# Patient Record
Sex: Male | Born: 1990 | Race: Black or African American | Hispanic: No | Marital: Single | Smoking: Former smoker
Health system: Southern US, Community
[De-identification: ages and names within clinical notes are randomized; demographics above are authoritative.]

## PROBLEM LIST (undated history)

## (undated) DIAGNOSIS — J45909 Unspecified asthma, uncomplicated: Secondary | ICD-10-CM

---

## 2008-09-03 ENCOUNTER — Emergency Department (HOSPITAL_COMMUNITY): Admission: EM | Admit: 2008-09-03 | Discharge: 2008-09-03 | Payer: Self-pay | Admitting: Emergency Medicine

## 2009-06-10 ENCOUNTER — Emergency Department (HOSPITAL_COMMUNITY): Admission: EM | Admit: 2009-06-10 | Discharge: 2009-06-10 | Payer: Self-pay | Admitting: Emergency Medicine

## 2010-08-11 IMAGING — CR DG CHEST 2V
2 series · 2 of 2 positions shown · non-contrast
Comparison: None.

CLINICAL DATA: Shortness breath for 3 days .

CHEST - 2 VIEW

[view not recorded (1 of 2)]
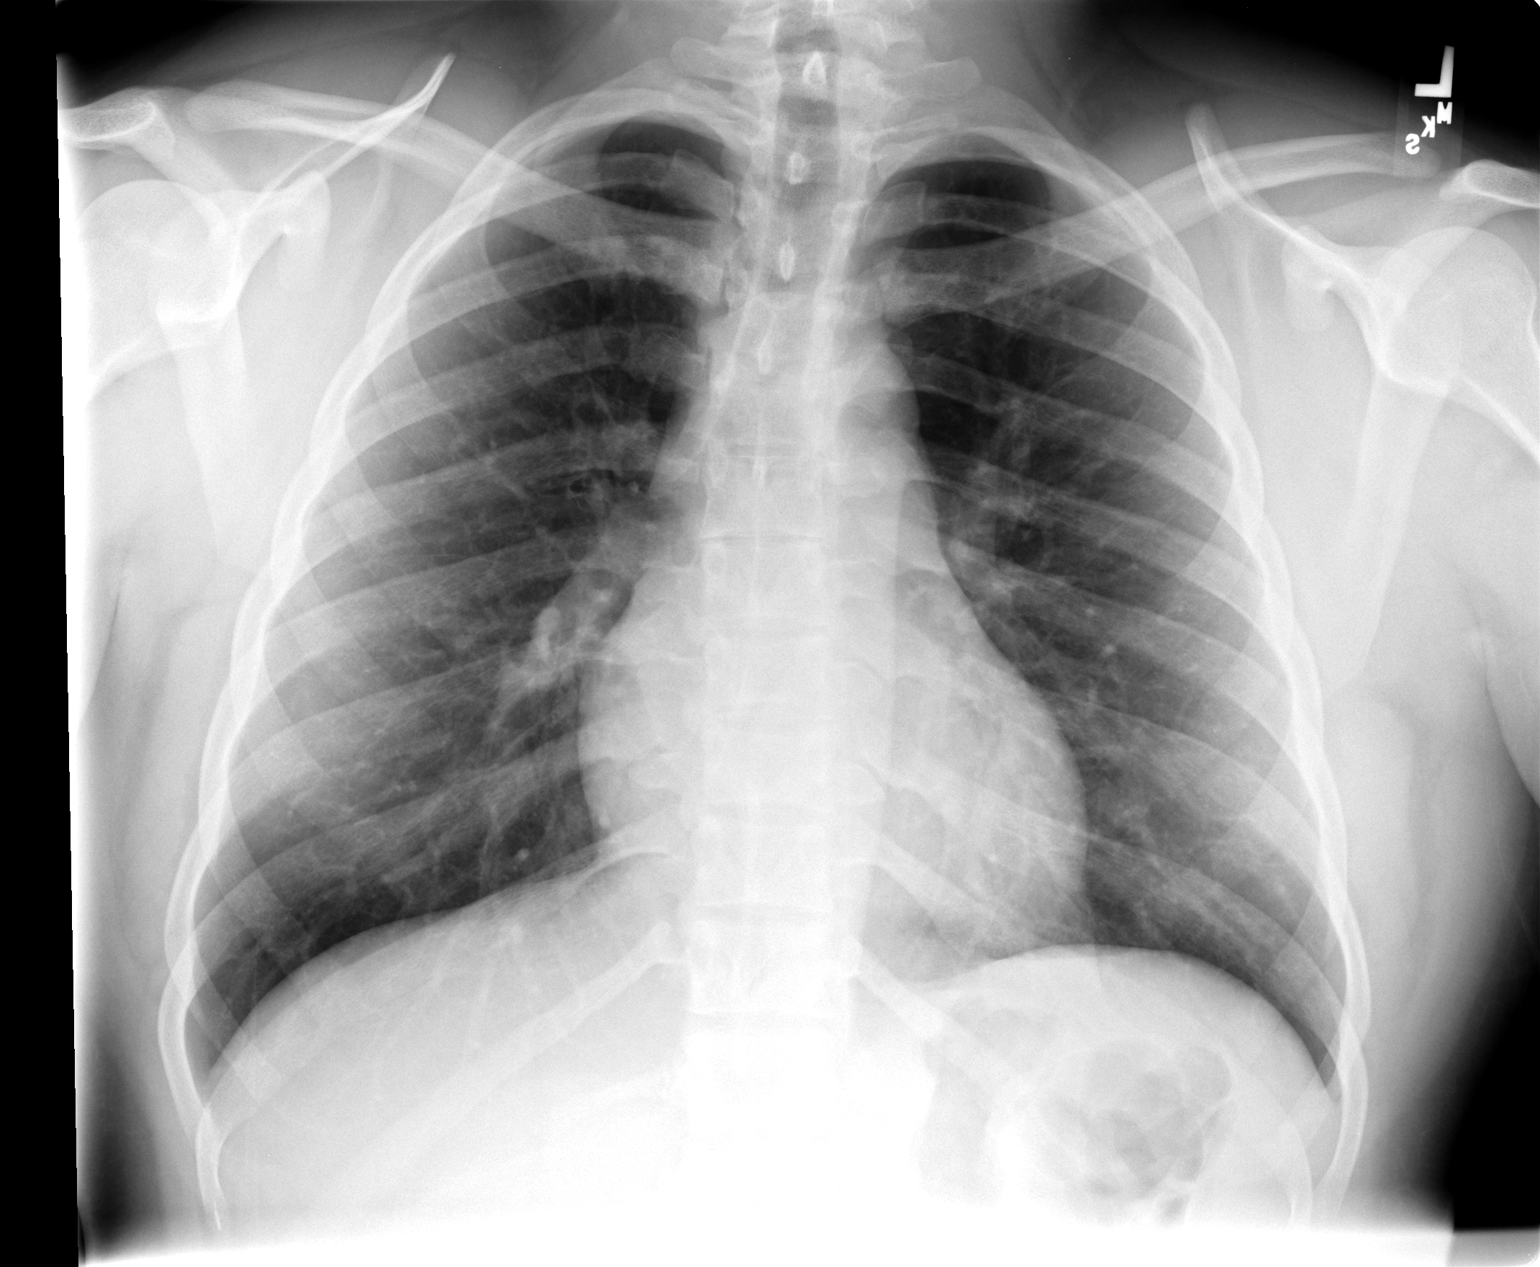

[view not recorded (2 of 2)]
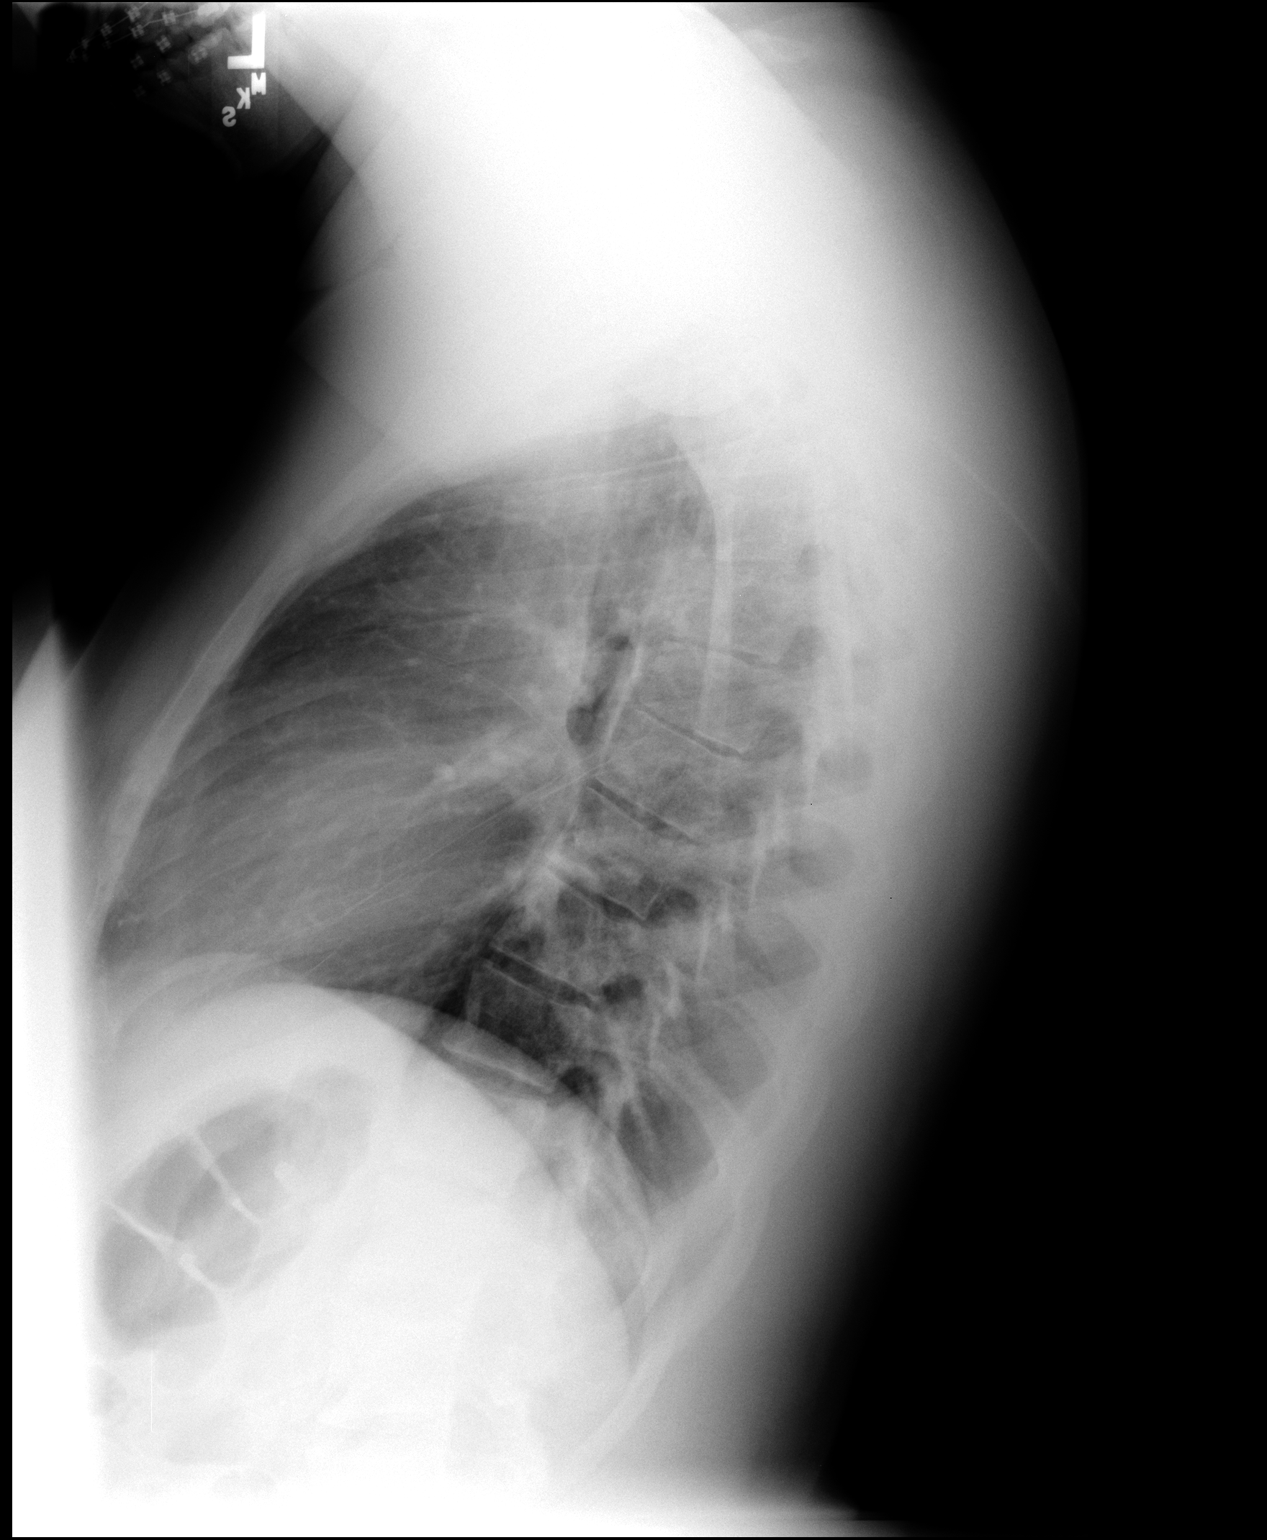

[2 of 2 positions shown; findings below may reference images not displayed]

FINDINGS: Normal mediastinum and heart silhouette.  Costophrenic
angles are clear.  No effusion, infiltrate, or pneumothorax.
IMPRESSION: Normal chest radiograph.

## 2013-11-29 ENCOUNTER — Ambulatory Visit (HOSPITAL_COMMUNITY): Payer: Self-pay | Admitting: Psychiatry

## 2017-07-03 ENCOUNTER — Other Ambulatory Visit: Payer: Self-pay

## 2017-07-03 ENCOUNTER — Emergency Department (HOSPITAL_COMMUNITY): Payer: Medicaid Other

## 2017-07-03 ENCOUNTER — Emergency Department (HOSPITAL_COMMUNITY)
Admission: EM | Admit: 2017-07-03 | Discharge: 2017-07-03 | Disposition: A | Discharge status: Home / Self Care | Payer: Medicaid Other | Attending: Emergency Medicine | Admitting: Emergency Medicine

## 2017-07-03 ENCOUNTER — Encounter (HOSPITAL_COMMUNITY): Payer: Self-pay | Admitting: *Deleted

## 2017-07-03 DIAGNOSIS — J45909 Unspecified asthma, uncomplicated: Secondary | ICD-10-CM | POA: Diagnosis not present

## 2017-07-03 DIAGNOSIS — F17200 Nicotine dependence, unspecified, uncomplicated: Secondary | ICD-10-CM | POA: Diagnosis not present

## 2017-07-03 DIAGNOSIS — R079 Chest pain, unspecified: Secondary | ICD-10-CM | POA: Diagnosis present

## 2017-07-03 HISTORY — DX: Unspecified asthma, uncomplicated: J45.909

## 2017-07-03 LAB — BASIC METABOLIC PANEL
Anion gap: 11 (ref 5–15)
BUN: 9 mg/dL (ref 6–20)
CO2: 24 mmol/L (ref 22–32)
CREATININE: 0.96 mg/dL (ref 0.61–1.24)
Calcium: 9.6 mg/dL (ref 8.9–10.3)
Chloride: 105 mmol/L (ref 101–111)
GFR calc Af Amer: >60 mL/min (ref >60)
GLUCOSE: 100 mg/dL — AB (ref 65–99)
POTASSIUM: 4.7 mmol/L (ref 3.5–5.1)
SODIUM: 140 mmol/L (ref 135–145)

## 2017-07-03 LAB — CBC WITH DIFFERENTIAL/PLATELET
BASOS ABS: 0 10*3/uL (ref 0.0–0.1)
BASOS PCT: 0 %
EOS ABS: 0.1 10*3/uL (ref 0.0–0.7)
Eosinophils Relative: 1 %
HCT: 44.1 % (ref 39.0–52.0)
Hemoglobin: 14.4 g/dL (ref 13.0–17.0)
Lymphocytes Relative: 34 %
Lymphs Abs: 2.7 10*3/uL (ref 0.7–4.0)
MCH: 28.9 pg (ref 26.0–34.0)
MCHC: 32.7 g/dL (ref 30.0–36.0)
MCV: 88.4 fL (ref 78.0–100.0)
MONO ABS: 0.4 10*3/uL (ref 0.1–1.0)
MONOS PCT: 5 %
Neutro Abs: 4.8 10*3/uL (ref 1.7–7.7)
Neutrophils Relative %: 60 %
Platelets: 200 10*3/uL (ref 150–400)
RBC: 4.99 MIL/uL (ref 4.22–5.81)
RDW: 13.7 % (ref 11.5–15.5)
WBC: 8 10*3/uL (ref 4.0–10.5)

## 2017-07-03 LAB — I-STAT TROPONIN, ED: Troponin i, poc: 0 ng/mL (ref 0.00–0.08)

## 2017-07-03 NOTE — ED Triage Notes (Signed)
The pt has asthma and he is c/o chest pain since last pm  He was seen at an urgent care in [place]  He  Reports that he was not told anything.  No audible wheezes no sob

## 2017-07-03 NOTE — ED Notes (Signed)
The pt had a chest xray last pm

## 2017-07-03 NOTE — ED Provider Notes (Signed)
[facility] Provider Note   CSN: [id number] Arrival date & time: 07/03/17  0045     History   Chief Complaint Chief Complaint  Patient presents with  . Asthma    HPI Bill Cummings is a 27 y.o. male.  The history is provided by the patient and medical records.  Asthma  Associated symptoms include chest pain.     27 year old male with history of asthma, anxiety, depression, presenting to the ED with chest pain.  States it feels like a tightness in the left side of his chest.  This is been ongoing for 3 days now.  Symptoms have been constant since onset.  States he feels like he cannot take a deep breath.  Symptoms do seem worse when up and moving around or when he focuses on them, better when sitting down and relaxing.  He is a smoker.  He has no known cardiac history.  He has not had any cough, fever, or other upper respiratory symptoms.  Patient was recently restarted on Zoloft and Ativan prescribed by his primary care doctor.  States when he took these in the past he had similar symptoms, states his doctor still restarted him on these but at lower doses than prior.  States he feels like his whole body she is "out of whack" now.  Past Medical History:  Diagnosis Date  . Asthma     There are no active problems to display for this patient.   History reviewed. No pertinent surgical history.      Home Medications    Prior to Admission medications   Not on File    Family History No family history on file.  Social History Social History   Tobacco Use  . Smoking status: Current Every Day Smoker  . Smokeless tobacco: Never Used  Substance Use Topics  . Alcohol use: Not on file  . Drug use: Not on file     Allergies   Tylenol [acetaminophen]   Review of Systems Review of Systems  Respiratory: Positive for chest tightness.   Cardiovascular: Positive for chest pain.  All other systems reviewed and are  negative.    Physical Exam Updated Vital Signs BP (!) 159/94 (BP Location: Right Arm)   Pulse 66   Temp 98.4 F (36.9 C) (Oral)   Resp 16   Ht 5\' 6"  (1.676 m)   Wt 77.1 kg (170 lb)   SpO2 100%   BMI 27.44 kg/m   Physical Exam  Constitutional: He is oriented to person, place, and time. He appears well-developed and well-nourished.  HENT:  Head: Normocephalic and atraumatic.  Mouth/Throat: Oropharynx is clear and moist.  Eyes: Pupils are equal, round, and reactive to light. Conjunctivae and EOM are normal.  Neck: Normal range of motion.  Cardiovascular: Normal rate, regular rhythm and normal heart sounds.  Pulmonary/Chest: Effort normal and breath sounds normal. No stridor. No respiratory distress.  Abdominal: Soft. Bowel sounds are normal. There is no tenderness. There is no rebound.  Musculoskeletal: Normal range of motion.  Neurological: He is alert and oriented to person, place, and time.  Skin: Skin is warm and dry.  Psychiatric: He has a normal mood and affect.  Nursing note and vitals reviewed.    ED Treatments / Results  Labs (all labs ordered are listed, but only abnormal results are displayed) Labs Reviewed  BASIC METABOLIC PANEL - Abnormal; Notable for the following components:      Result Value  Glucose, Bld 100 (*)    All other components within normal limits  CBC WITH DIFFERENTIAL/PLATELET  I-STAT TROPONIN, ED    EKG EKG Interpretation  Date/Time:  Sunday July 03 2017 01:09:46 EDT Ventricular Rate:  76 PR Interval:  170 QRS Duration: 100 QT Interval:  384 QTC Calculation: 432 R Axis:   34 Text Interpretation:  Normal sinus rhythm probable LVH No significant change since last tracing Confirmed by Zadie Rhine ([id number]) on 07/03/2017 2:49:17 AM   Radiology Dg Chest 2 View  Result Date: 07/03/2017 CLINICAL DATA:  Asthma and chest pain since last evening. EXAM: CHEST - 2 VIEW COMPARISON:  06/10/2009 CXR FINDINGS: The heart size and mediastinal  contours are within normal limits. Both lungs are clear. The visualized skeletal structures are unremarkable. IMPRESSION: No active cardiopulmonary disease. Electronically Signed   By: Tollie Eth M.D.   On: 07/03/2017 03:00    Procedures Procedures (including critical care time)  Medications Ordered in ED Medications - No data to display   Initial Impression / Assessment and Plan / ED Course  I have reviewed the triage vital signs and the nursing notes.  Pertinent labs & imaging results that were available during my care of the patient were reviewed by me and considered in my medical decision making (see chart for details).  27 year old male presenting to the ED with chest pain.  Has been ongoing for 3 days.  Seen at urgent care yesterday and had chest x-ray but was never told the results were given any information about his diagnosis.  States he has had these symptoms before when taking psychiatric medications.  He was restarted on these last week by his PCP.  Currently taking sertraline and Ativan.  EKG is normal sinus rhythm, no acute ischemic changes.  Lab work is reassuring.  Chest x-ray is clear.  Patient's vitals have remained stable throughout ED stay.  He has not had any tachycardia or hypoxia to suggest PE.  I have low suspicion for ACS, dissection, acute cardiac event.  Some of his symptoms may be related to his anxiety versus medication side effects.  I recommended that he follow-up closely with his primary care doctor to see if any adjustments to medications should be made.  Discussed plan with patient, he acknowledged understanding and agreed with plan of care.  Return precautions given for new or worsening symptoms.  Final Clinical Impressions(s) / ED Diagnoses   Final diagnoses:  Chest pain in adult    ED Discharge Orders    None       Garlon Hatchet, PA-C 07/03/17 0536    Zadie Rhine, MD 07/03/17 [phone]

## 2017-07-03 NOTE — Discharge Instructions (Signed)
If you continue having symptoms I would stop your medications. You need to contact your doctor and have another follow-up to address your symptoms/medications. Return here for any new/acute changes.

## 2017-12-03 ENCOUNTER — Emergency Department (HOSPITAL_COMMUNITY)
Admission: EM | Admit: 2017-12-03 | Discharge: 2017-12-03 | Disposition: A | Discharge status: Home / Self Care | Payer: Medicaid Other | Attending: Emergency Medicine | Admitting: Emergency Medicine

## 2017-12-03 ENCOUNTER — Encounter (HOSPITAL_COMMUNITY): Payer: Self-pay | Admitting: Emergency Medicine

## 2017-12-03 DIAGNOSIS — J45909 Unspecified asthma, uncomplicated: Secondary | ICD-10-CM | POA: Insufficient documentation

## 2017-12-03 DIAGNOSIS — R05 Cough: Secondary | ICD-10-CM | POA: Diagnosis present

## 2017-12-03 DIAGNOSIS — Z87891 Personal history of nicotine dependence: Secondary | ICD-10-CM | POA: Diagnosis not present

## 2017-12-03 DIAGNOSIS — Z79899 Other long term (current) drug therapy: Secondary | ICD-10-CM | POA: Insufficient documentation

## 2017-12-03 DIAGNOSIS — B9789 Other viral agents as the cause of diseases classified elsewhere: Secondary | ICD-10-CM | POA: Diagnosis not present

## 2017-12-03 DIAGNOSIS — J069 Acute upper respiratory infection, unspecified: Secondary | ICD-10-CM | POA: Diagnosis not present

## 2017-12-03 MED ORDER — FLUTICASONE PROPIONATE 50 MCG/ACT NA SUSP: 2.0000 | Freq: Every day | NASAL | 0 refills | Status: AC

## 2017-12-03 MED ORDER — CETIRIZINE HCL 10 MG PO TABS: 10.0000 mg | ORAL_TABLET | Freq: Every day | ORAL | 0 refills | Status: AC

## 2017-12-03 MED ORDER — DEXTROMETHORPHAN POLISTIREX ER 30 MG/5ML PO SUER: 30.0000 mg | Freq: Every evening | ORAL | 0 refills | Status: AC | PRN

## 2017-12-03 NOTE — ED Notes (Signed)
Pt verbalizes understanding of d/c instructions. Prescriptions reviewed with patient. Pt ambulatory at d/c with all belongings.  

## 2017-12-03 NOTE — ED Triage Notes (Signed)
Reports living in an apartment for a couple of months.  Unsure if there is black mold there.  Has not seen any but having allergy type symptoms.  Runny nose and cough.  Denies any fevers.

## 2017-12-03 NOTE — Discharge Instructions (Addendum)
Drink plenty water and get plenty of rest.  Gargle warm salt water and spit it out for sore throat. May also use cough drops, warm teas, etc. Take flonase to decrease nasal congestion. Zyrtec for nasal congestion and scratchy throat.  Use Delsym for cough.  Can also use your albuterol inhaler and albuterol nebulizers as needed for shortness of breath.   Followup with your primary care doctor in 5-7 days for recheck of ongoing symptoms. Return to emergency department for emergent changing or worsening of symptoms such as throat tightness, facial swelling, fever not controlled by ibuprofen or Tylenol,difficulty breathing, or chest pain.

## 2017-12-03 NOTE — ED Provider Notes (Signed)
[facility] EMERGENCY DEPARTMENT Provider Note   CSN: [id number] Arrival date & time: 12/03/17  1907     History   Chief Complaint Chief Complaint  Patient presents with  . wellness check    HPI Bill Cummings is a 27 y.o. male with history of asthma presents today for evaluation of acute onset, progressively improving shortness of breath beginning today.  He states that when he woke this morning he felt as though he could not take a deep breath.  He states this feels like a mild asthma exacerbation.  He notes nasal congestion for "a long time "which he states is consistent with his allergies and the seasons changing.  He has been using Mucinex with some relief.  He notes cough which is sometimes productive of green sputum.  Notes mild sore throat.  Denies fevers, chest pain, or headaches.  No aggravating or alleviating factors.  He is a former smoker.  He does note that he has some mold growing in his bedroom which he noticed this morning. The history is provided by the patient.    Past Medical History:  Diagnosis Date  . Asthma     There are no active problems to display for this patient.   History reviewed. No pertinent surgical history.      Home Medications    Prior to Admission medications   Medication Sig Start Date End Date Taking? Authorizing Provider  cetirizine (ZYRTEC ALLERGY) 10 MG tablet Take 1 tablet (10 mg total) by mouth daily. 12/03/17   Wesly Whisenant A, PA-C  dextromethorphan (DELSYM) 30 MG/5ML liquid Take 5 mLs (30 mg total) by mouth at bedtime as needed for cough. 12/03/17   Kamyia Thomason A, PA-C  fluticasone (FLONASE) 50 MCG/ACT nasal spray Place 2 sprays into both nostrils daily. 12/03/17   Brandilee Pies A, PA-C  LORazepam (ATIVAN) 1 MG tablet Take 1 mg by mouth every 8 (eight) hours as needed for anxiety.  06/28/17   [provider]  sertraline (ZOLOFT) 50 MG tablet Take 50 mg by mouth daily. 06/28/17   [provider]    Family  History No family history on file.  Social History Social History   Tobacco Use  . Smoking status: Former Games developer  . Smokeless tobacco: Never Used  Substance Use Topics  . Alcohol use: Yes    Comment: occasionally  . Drug use: Never     Allergies   Acetaminophen   Review of Systems Review of Systems  Constitutional: Negative for chills and fever.  HENT: Positive for congestion, sneezing and sore throat. Negative for trouble swallowing.   Respiratory: Positive for cough and shortness of breath.   Cardiovascular: Negative for chest pain.  Neurological: Negative for headaches.     Physical Exam Updated Vital Signs BP 128/78 (BP Location: Right Arm)   Pulse 90   Temp 99.3 F (37.4 C) (Oral)   Resp 16   Ht 5\' 8"  (1.727 m)   Wt 89.8 kg   SpO2 99%   BMI 30.11 kg/m   Physical Exam  Constitutional: He appears well-developed and well-nourished. No distress.  HENT:  Head: Normocephalic and atraumatic.  TMs without erythema or bulging bilaterally.  Nasal septum midline, mucosal edema noted bilaterally with pale pink boggy mucosa.  No frontal or maxillary sinus tenderness.  Posterior oropharynx with postnasal drip, no tonsillar hypertrophy, exudates, uvular deviation, or trismus.  Tolerating secretions without difficulty.  Eyes: Pupils are equal, round, and reactive to light. Conjunctivae  and EOM are normal. Right eye exhibits no discharge. Left eye exhibits no discharge.  Neck: Normal range of motion. Neck supple. No JVD present. No tracheal deviation present.  Cardiovascular: Normal rate, regular rhythm and normal heart sounds.  Pulmonary/Chest: Effort normal and breath sounds normal. No stridor. No respiratory distress. He has no wheezes. He has no rales. He exhibits no tenderness.  Abdominal: He exhibits no distension.  Musculoskeletal: He exhibits no edema.  Neurological: He is alert.  Skin: Skin is warm and dry. No erythema.  Psychiatric: He has a normal mood and  affect. His behavior is normal.  Nursing note and vitals reviewed.    ED Treatments / Results  Labs (all labs ordered are listed, but only abnormal results are displayed) Labs Reviewed - No data to display  EKG None  Radiology No results found.  Procedures Procedures (including critical care time)  Medications Ordered in ED Medications - No data to display   Initial Impression / Assessment and Plan / ED Course  I have reviewed the triage vital signs and the nursing notes.  Pertinent labs & imaging results that were available during my care of the patient were reviewed by me and considered in my medical decision making (see chart for details).     Patient with symptoms consistent with viral URI with cough.  He declines chest x-ray at this time and I have a low suspicion of pneumonia in the absence of fever and patient is overall well-appearing.  Doubt PE.  He is afebrile, vital signs are stable. patients symptoms are consistent with URI, likely viral etiology. Discussed that antibiotics are not indicated for viral infections. Pt will be discharged with symptomatic treatment.  No increased work of breathing, lungs clear to auscultation bilaterally.  Do recommend use of his nebulizers and albuterol rescue inhaler as needed.  Discussed red ED return precautions. Pt verbalized understanding of and agreement with plan and is safe for discharge home at this time.    Final Clinical Impressions(s) / ED Diagnoses   Final diagnoses:  Viral URI with cough    ED Discharge Orders         Ordered    fluticasone (FLONASE) 50 MCG/ACT nasal spray  Daily     12/03/17 2019    cetirizine (ZYRTEC ALLERGY) 10 MG tablet  Daily     12/03/17 2019    dextromethorphan (DELSYM) 30 MG/5ML liquid  At bedtime PRN     12/03/17 2019           Jeanie Sewer, PA-C 12/03/17 2021    Margarita Grizzle, MD 12/03/17 [phone]

## 2017-12-03 NOTE — ED Notes (Signed)
See EDP assessment 

## 2021-02-26 DIAGNOSIS — Z419 Encounter for procedure for purposes other than remedying health state, unspecified: Secondary | ICD-10-CM | POA: Diagnosis not present

## 2021-03-29 DIAGNOSIS — Z419 Encounter for procedure for purposes other than remedying health state, unspecified: Secondary | ICD-10-CM | POA: Diagnosis not present

## 2021-04-29 DIAGNOSIS — Z419 Encounter for procedure for purposes other than remedying health state, unspecified: Secondary | ICD-10-CM | POA: Diagnosis not present

## 2021-05-27 DIAGNOSIS — Z419 Encounter for procedure for purposes other than remedying health state, unspecified: Secondary | ICD-10-CM | POA: Diagnosis not present

## 2021-06-27 DIAGNOSIS — Z419 Encounter for procedure for purposes other than remedying health state, unspecified: Secondary | ICD-10-CM | POA: Diagnosis not present

## 2021-07-27 DIAGNOSIS — Z419 Encounter for procedure for purposes other than remedying health state, unspecified: Secondary | ICD-10-CM | POA: Diagnosis not present

## 2021-08-27 DIAGNOSIS — Z419 Encounter for procedure for purposes other than remedying health state, unspecified: Secondary | ICD-10-CM | POA: Diagnosis not present

## 2021-09-26 DIAGNOSIS — Z419 Encounter for procedure for purposes other than remedying health state, unspecified: Secondary | ICD-10-CM | POA: Diagnosis not present

## 2021-10-27 DIAGNOSIS — Z419 Encounter for procedure for purposes other than remedying health state, unspecified: Secondary | ICD-10-CM | POA: Diagnosis not present

## 2021-11-27 DIAGNOSIS — Z419 Encounter for procedure for purposes other than remedying health state, unspecified: Secondary | ICD-10-CM | POA: Diagnosis not present

## 2021-12-27 DIAGNOSIS — Z419 Encounter for procedure for purposes other than remedying health state, unspecified: Secondary | ICD-10-CM | POA: Diagnosis not present

## 2022-01-27 DIAGNOSIS — Z419 Encounter for procedure for purposes other than remedying health state, unspecified: Secondary | ICD-10-CM | POA: Diagnosis not present

## 2022-02-26 DIAGNOSIS — Z419 Encounter for procedure for purposes other than remedying health state, unspecified: Secondary | ICD-10-CM | POA: Diagnosis not present

## 2022-03-29 DIAGNOSIS — Z419 Encounter for procedure for purposes other than remedying health state, unspecified: Secondary | ICD-10-CM | POA: Diagnosis not present

## 2022-04-29 DIAGNOSIS — Z419 Encounter for procedure for purposes other than remedying health state, unspecified: Secondary | ICD-10-CM | POA: Diagnosis not present

## 2022-05-28 DIAGNOSIS — Z419 Encounter for procedure for purposes other than remedying health state, unspecified: Secondary | ICD-10-CM | POA: Diagnosis not present

## 2022-06-28 DIAGNOSIS — Z419 Encounter for procedure for purposes other than remedying health state, unspecified: Secondary | ICD-10-CM | POA: Diagnosis not present

## 2022-07-28 DIAGNOSIS — Z419 Encounter for procedure for purposes other than remedying health state, unspecified: Secondary | ICD-10-CM | POA: Diagnosis not present

## 2022-08-28 DIAGNOSIS — Z419 Encounter for procedure for purposes other than remedying health state, unspecified: Secondary | ICD-10-CM | POA: Diagnosis not present

## 2022-09-27 DIAGNOSIS — Z419 Encounter for procedure for purposes other than remedying health state, unspecified: Secondary | ICD-10-CM | POA: Diagnosis not present

## 2022-10-28 DIAGNOSIS — Z419 Encounter for procedure for purposes other than remedying health state, unspecified: Secondary | ICD-10-CM | POA: Diagnosis not present

## 2022-11-28 DIAGNOSIS — Z419 Encounter for procedure for purposes other than remedying health state, unspecified: Secondary | ICD-10-CM | POA: Diagnosis not present
